# Patient Record
Sex: Female | Born: 1995 | Race: White | Hispanic: No | Marital: Single | Smoking: Never smoker
Health system: Southern US, Community
[De-identification: ages and names within clinical notes are randomized; demographics above are authoritative.]

---
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
## 2017-02-07 DIAGNOSIS — Z309 Encounter for contraceptive management, unspecified: Secondary | ICD-10-CM | POA: Diagnosis not present

## 2017-02-25 DIAGNOSIS — Z0000 Encounter for general adult medical examination without abnormal findings: Secondary | ICD-10-CM | POA: Diagnosis not present

## 2017-03-12 DIAGNOSIS — Z6828 Body mass index (BMI) 28.0-28.9, adult: Secondary | ICD-10-CM | POA: Diagnosis not present

## 2017-03-12 DIAGNOSIS — Z01419 Encounter for gynecological examination (general) (routine) without abnormal findings: Secondary | ICD-10-CM | POA: Diagnosis not present

## 2017-03-19 DIAGNOSIS — R05 Cough: Secondary | ICD-10-CM | POA: Diagnosis not present

## 2017-03-19 DIAGNOSIS — J069 Acute upper respiratory infection, unspecified: Secondary | ICD-10-CM | POA: Diagnosis not present

## 2017-03-19 DIAGNOSIS — R0602 Shortness of breath: Secondary | ICD-10-CM | POA: Diagnosis not present

## 2017-12-23 DIAGNOSIS — L988 Other specified disorders of the skin and subcutaneous tissue: Secondary | ICD-10-CM | POA: Diagnosis not present

## 2018-04-07 DIAGNOSIS — R5383 Other fatigue: Secondary | ICD-10-CM | POA: Diagnosis not present

## 2018-04-07 DIAGNOSIS — M791 Myalgia, unspecified site: Secondary | ICD-10-CM | POA: Diagnosis not present

## 2018-04-07 DIAGNOSIS — R509 Fever, unspecified: Secondary | ICD-10-CM | POA: Diagnosis not present

## 2018-04-07 DIAGNOSIS — J09X2 Influenza due to identified novel influenza A virus with other respiratory manifestations: Secondary | ICD-10-CM | POA: Diagnosis not present

## 2018-04-07 DIAGNOSIS — R51 Headache: Secondary | ICD-10-CM | POA: Diagnosis not present

## 2018-04-15 DIAGNOSIS — Z01419 Encounter for gynecological examination (general) (routine) without abnormal findings: Secondary | ICD-10-CM | POA: Diagnosis not present

## 2018-04-15 DIAGNOSIS — Z6829 Body mass index (BMI) 29.0-29.9, adult: Secondary | ICD-10-CM | POA: Diagnosis not present

## 2018-06-13 DIAGNOSIS — M549 Dorsalgia, unspecified: Secondary | ICD-10-CM | POA: Diagnosis not present

## 2018-09-15 DIAGNOSIS — Z9109 Other allergy status, other than to drugs and biological substances: Secondary | ICD-10-CM | POA: Diagnosis not present

## 2018-09-15 DIAGNOSIS — F411 Generalized anxiety disorder: Secondary | ICD-10-CM | POA: Diagnosis not present

## 2018-09-15 DIAGNOSIS — F3341 Major depressive disorder, recurrent, in partial remission: Secondary | ICD-10-CM | POA: Diagnosis not present

## 2018-10-17 DIAGNOSIS — F3341 Major depressive disorder, recurrent, in partial remission: Secondary | ICD-10-CM | POA: Diagnosis not present

## 2018-10-17 DIAGNOSIS — F411 Generalized anxiety disorder: Secondary | ICD-10-CM | POA: Diagnosis not present

## 2018-10-17 DIAGNOSIS — J3489 Other specified disorders of nose and nasal sinuses: Secondary | ICD-10-CM | POA: Diagnosis not present

## 2018-12-11 DIAGNOSIS — M7989 Other specified soft tissue disorders: Secondary | ICD-10-CM | POA: Diagnosis not present

## 2018-12-11 DIAGNOSIS — Z23 Encounter for immunization: Secondary | ICD-10-CM | POA: Diagnosis not present

## 2018-12-11 DIAGNOSIS — M79651 Pain in right thigh: Secondary | ICD-10-CM | POA: Diagnosis not present

## 2019-01-09 DIAGNOSIS — Z20828 Contact with and (suspected) exposure to other viral communicable diseases: Secondary | ICD-10-CM | POA: Diagnosis not present

## 2019-01-22 DIAGNOSIS — Z20828 Contact with and (suspected) exposure to other viral communicable diseases: Secondary | ICD-10-CM | POA: Diagnosis not present

## 2019-01-27 DIAGNOSIS — Z20828 Contact with and (suspected) exposure to other viral communicable diseases: Secondary | ICD-10-CM | POA: Diagnosis not present

## 2019-02-09 DIAGNOSIS — Z20828 Contact with and (suspected) exposure to other viral communicable diseases: Secondary | ICD-10-CM | POA: Diagnosis not present

## 2019-02-16 DIAGNOSIS — Z20828 Contact with and (suspected) exposure to other viral communicable diseases: Secondary | ICD-10-CM | POA: Diagnosis not present

## 2019-02-19 ENCOUNTER — Encounter: Payer: PRIVATE HEALTH INSURANCE | Admitting: Vascular Surgery

## 2019-02-19 ENCOUNTER — Encounter (HOSPITAL_COMMUNITY): Payer: PRIVATE HEALTH INSURANCE

## 2019-02-24 DIAGNOSIS — Z20828 Contact with and (suspected) exposure to other viral communicable diseases: Secondary | ICD-10-CM | POA: Diagnosis not present

## 2019-03-03 DIAGNOSIS — Z20828 Contact with and (suspected) exposure to other viral communicable diseases: Secondary | ICD-10-CM | POA: Diagnosis not present

## 2019-03-06 DIAGNOSIS — Z0000 Encounter for general adult medical examination without abnormal findings: Secondary | ICD-10-CM | POA: Diagnosis not present

## 2019-03-10 DIAGNOSIS — Z0000 Encounter for general adult medical examination without abnormal findings: Secondary | ICD-10-CM | POA: Diagnosis not present

## 2019-03-10 DIAGNOSIS — Z13220 Encounter for screening for lipoid disorders: Secondary | ICD-10-CM | POA: Diagnosis not present

## 2019-03-10 DIAGNOSIS — Z20828 Contact with and (suspected) exposure to other viral communicable diseases: Secondary | ICD-10-CM | POA: Diagnosis not present

## 2019-03-17 DIAGNOSIS — Z20828 Contact with and (suspected) exposure to other viral communicable diseases: Secondary | ICD-10-CM | POA: Diagnosis not present

## 2019-03-24 DIAGNOSIS — Z20828 Contact with and (suspected) exposure to other viral communicable diseases: Secondary | ICD-10-CM | POA: Diagnosis not present

## 2019-04-09 ENCOUNTER — Encounter: Payer: PRIVATE HEALTH INSURANCE | Admitting: Vascular Surgery

## 2019-04-09 ENCOUNTER — Encounter (HOSPITAL_COMMUNITY): Payer: PRIVATE HEALTH INSURANCE

## 2019-04-14 ENCOUNTER — Telehealth (HOSPITAL_COMMUNITY): Payer: Self-pay

## 2019-04-14 NOTE — Telephone Encounter (Signed)

## 2019-04-15 ENCOUNTER — Other Ambulatory Visit: Payer: Self-pay

## 2019-04-15 ENCOUNTER — Ambulatory Visit (HOSPITAL_COMMUNITY)
Admission: RE | Admit: 2019-04-15 | Discharge: 2019-04-15 | Disposition: A | Discharge status: Home / Self Care | Payer: BC Managed Care – PPO | Source: Ambulatory Visit | Attending: Surgery | Admitting: Surgery

## 2019-04-15 DIAGNOSIS — M79651 Pain in right thigh: Secondary | ICD-10-CM

## 2019-04-16 ENCOUNTER — Ambulatory Visit: Payer: BC Managed Care – PPO | Admitting: Vascular Surgery

## 2019-04-16 ENCOUNTER — Other Ambulatory Visit: Payer: Self-pay

## 2019-04-16 ENCOUNTER — Encounter: Payer: Self-pay | Admitting: Vascular Surgery

## 2019-04-16 VITALS — BP 129/83 | HR 85 | Temp 97.9°F | Resp 16 | Ht 64.0 in | Wt 152.0 lb

## 2019-04-16 DIAGNOSIS — D1809 Hemangioma of other sites: Secondary | ICD-10-CM

## 2019-04-16 DIAGNOSIS — M79651 Pain in right thigh: Secondary | ICD-10-CM

## 2019-04-16 NOTE — Progress Notes (Signed)
REASON FOR CONSULT:    To evaluate right thigh pain.  The consult is requested by Dr. Jenny Reichmann.  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  She states that she has had a persistent mass year and persistent pain and has had no further work-up of this.  For this reason I recommended that we obtain a follow-up MRI of the thigh with and without contrast.  I discussed this with radiology who feels that this is the best test for follow-up.  I will have a virtual visit over the phone with her after her MRI is complete.  CHRONIC VENOUS INSUFFICIENCY: Based on her venous duplex scan today she does have deep venous reflux involving the right common femoral vein.  I do not think this would explain her symptoms.  She has no evidence of DVT and no superficial venous reflux.  We have discussed the importance of intermittent leg elevation and the proper positioning for this.  We discussed possibly using compression stockings during the day however she is very active and does not spend much time standing or sitting.   Deitra Mayo, MD Office: [phone]   HPI:   Julie Drake is a pleasant 24 y.o. female, who was referred with right thigh pain.  I have reviewed the records from the referring office.  The patient was seen by Genelle Bal, FNP on 12/11/2018.  The patient had a cyst on the right thigh which was inflamed and tender to touch.  She had noted that this had been there since fifth grade.  This has been worked up in the past including MRI apparently 8 years ago which showed possibly a small hemangioma.  Patient was referred for vascular consultation.  On my history this patient has had this mass in the right thigh for over 10 years.  She states that it is tender and becomes more tender when she is very  active.  Apparently she is very active and exercises quite a bit and this is when this bothers her the most.  She does describe some occasional aching pain in her legs associated with standing and relieved with elevation.  The pain in her right thigh is relieved with elevation.  History reviewed. No pertinent past medical history.  Family History  Problem Relation Age of Onset  . Hypertension Mother     SOCIAL HISTORY: Social History   Socioeconomic History  . Marital status: Single    Spouse name: Not on file  . Number of children: Not on file  . Years of education: Not on file  . Highest education level: Not on file  Occupational History  . Not on file  Tobacco Use  . Smoking status: Never Smoker  . Smokeless tobacco: Never Used  Substance and Sexual Activity  . Alcohol use: No  . Drug use: No  . Sexual activity: Not on file  Other Topics Concern  . Not on file  Social History Narrative  . Not on file   Social Determinants of Health   Financial Resource Strain:   . Difficulty of Paying Living Expenses: Not on file  Food Insecurity:   . Worried About Charity fundraiser in the Last Year: Not on file  . Ran Out of Food in the Last Year: Not on file  Transportation Needs:   . Film/video editor (Medical): Not on file  . Lack of Transportation (Non-Medical): Not on file  Physical Activity:   . Days of Exercise per Week: Not on file  . Minutes of Exercise per Session: Not on file  Stress:   . Feeling of Stress : Not on file  Social Connections:   . Frequency of Communication with Friends and Family: Not on file  . Frequency of Social Gatherings with Friends and Family: Not on file  . Attends Religious Services: Not on file  . Active Member of Clubs or Organizations: Not on file  . Attends Archivist Meetings: Not on file  . Marital Status: Not on file  Intimate Partner Violence:   . Fear of Current or Ex-Partner: Not on file  . Emotionally Abused:  Not on file  . Physically Abused: Not on file  . Sexually Abused: Not on file    No Known Allergies  Current Outpatient Medications  Medication Sig Dispense Refill  . amoxicillin (AMOXIL) 875 MG tablet Take 1 tablet (875 mg total) by mouth 2 (two) times daily. 20 tablet 0  . ibuprofen (ADVIL,MOTRIN) 200 MG tablet Take 200 mg by mouth every 6 (six) hours as needed.    . Pseudoephedrine-APAP-DM (DAYQUIL PO) Take by mouth.     No current facility-administered medications for this visit.    REVIEW OF SYSTEMS:  [X]  denotes positive finding, [ ]  denotes negative finding Cardiac  Comments:  Chest pain or chest pressure:    Shortness of breath upon exertion:    Short of breath when lying flat:    Irregular heart rhythm:        Vascular    Pain in calf, thigh, or hip brought on by ambulation:    Pain in feet at night that wakes you up from your sleep:     Blood clot in your veins:    Leg swelling:         Pulmonary    Oxygen at home:    Productive cough:     Wheezing:         Neurologic    Sudden weakness in arms or legs:     Sudden numbness in arms or legs:     Sudden onset of difficulty speaking or slurred speech:    Temporary loss of vision in one eye:     Problems with dizziness:         Gastrointestinal    Blood in stool:     Vomited blood:         Genitourinary    Burning when urinating:     Blood in urine:        Psychiatric    Major depression:         Hematologic    Bleeding problems:    Problems with blood clotting too easily:        Skin    Rashes or ulcers:        Constitutional    Fever or chills:     PHYSICAL EXAM:   Vitals:   04/16/19 0931  Resp: 16  Weight: 152 lb (68.9 kg)  Height: 5\' 4"  (1.626 m)    GENERAL: The patient is a well-nourished female, in no acute distress. The vital signs are documented above. CARDIAC: There is a regular rate and rhythm.  VASCULAR: I do not detect carotid bruits. She has palpable femoral popliteal and  pedal pulses bilaterally. She does have what  appears to be a small mass in the right proximal to mid thigh.  I looked at this area with the [place] myself and I did not identify any mass however although this is clearly a limited study. She has no significant lower extremity swelling, hyperpigmentation, or significant varicose veins. PULMONARY: There is good air exchange bilaterally without wheezing or rales. ABDOMEN: Soft and non-tender with normal pitched bowel sounds.  MUSCULOSKELETAL: There are no major deformities or cyanosis. NEUROLOGIC: No focal weakness or paresthesias are detected. SKIN: There are no ulcers or rashes noted. PSYCHIATRIC: The patient has a normal affect.  DATA:    VENOUS DUPLEX: I have independently interpreted her right lower extremity venous duplex scan.  There is no evidence of DVT and no evidence of superficial venous thrombosis.  There is deep venous reflux involving the common femoral vein.  There is no significant superficial venous reflux.  Previous MRI 8 years ago which showed a small soft tissue lesion possibly consistent with a hemangioma or vascular malformation in the right anterior thigh.  This was not visualized on duplex today.  MRI: I have reviewed the MRI of the right thigh that was done on 02/10/2011.  I reviewed the images of her MRI and the finding to me is subtle.

## 2019-04-27 ENCOUNTER — Other Ambulatory Visit: Payer: Self-pay

## 2019-04-27 ENCOUNTER — Ambulatory Visit (HOSPITAL_COMMUNITY)
Admission: RE | Admit: 2019-04-27 | Discharge: 2019-04-27 | Disposition: A | Discharge status: Home / Self Care | Payer: BC Managed Care – PPO | Source: Ambulatory Visit | Attending: Vascular Surgery | Admitting: Vascular Surgery

## 2019-04-27 DIAGNOSIS — R2241 Localized swelling, mass and lump, right lower limb: Secondary | ICD-10-CM | POA: Diagnosis not present

## 2019-04-27 DIAGNOSIS — M79651 Pain in right thigh: Secondary | ICD-10-CM | POA: Diagnosis not present

## 2019-04-27 DIAGNOSIS — D1809 Hemangioma of other sites: Secondary | ICD-10-CM

## 2019-04-27 MED ORDER — GADOBUTROL 1 MMOL/ML IV SOLN
7.0000 mL | Freq: Once | INTRAVENOUS | Status: AC | PRN
  Administered 2019-04-27: 7 mL via INTRAVENOUS

## 2019-07-17 DIAGNOSIS — Z01419 Encounter for gynecological examination (general) (routine) without abnormal findings: Secondary | ICD-10-CM | POA: Diagnosis not present

## 2019-07-17 DIAGNOSIS — Z113 Encounter for screening for infections with a predominantly sexual mode of transmission: Secondary | ICD-10-CM | POA: Diagnosis not present

## 2019-07-17 DIAGNOSIS — Z6826 Body mass index (BMI) 26.0-26.9, adult: Secondary | ICD-10-CM | POA: Diagnosis not present

## 2019-08-06 DIAGNOSIS — R102 Pelvic and perineal pain: Secondary | ICD-10-CM | POA: Diagnosis not present

## 2019-08-06 DIAGNOSIS — N920 Excessive and frequent menstruation with regular cycle: Secondary | ICD-10-CM | POA: Diagnosis not present

## 2020-09-16 IMAGING — MR MR FEMUR*R* WO/W CM
8 series · 35 of 40 positions shown · IV contrast (gadavist)
Comparison: MRI 02/10/2011

CLINICAL DATA: Re-evaluate right thigh mass. Patient reports pain
associated with the mass.

EXAM:
MRI OF THE RIGHT FEMUR WITHOUT AND WITH CONTRAST
TECHNIQUE: Multiplanar, multisequence MR imaging of the right thigh was
performed both before and after administration of intravenous
contrast.
CONTRAST:  7mL GADAVIST GADOBUTROL 1 MMOL/ML IV SOLN

[Series 3: T1 · coronal · right · 4.0mm · 1.95mm/px · 4 of 40 slices shown (1 of 2)]
[im 1/40]
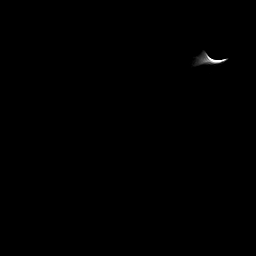
[im 14/40]
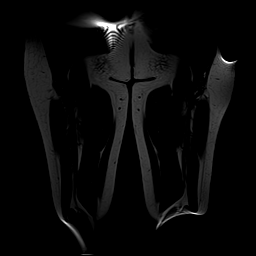
[im 27/40]
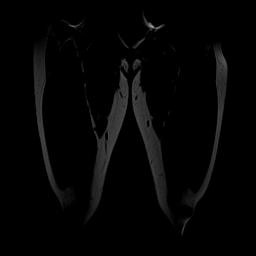
[im 40/40]
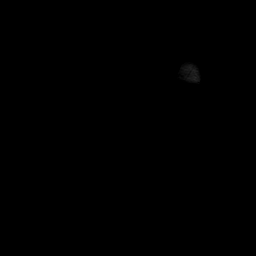

[Series 4: STIR · coronal · right · 4.0mm · 1.95mm/px · 4 of 40 slices shown (1 of 2)]
[im 1/40]
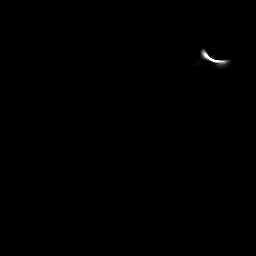
[im 14/40]
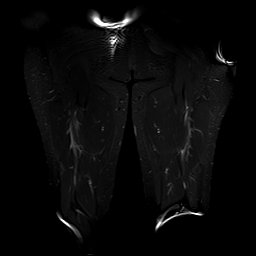
[im 27/40]
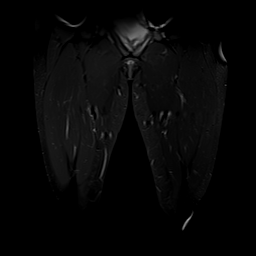
[im 40/40]
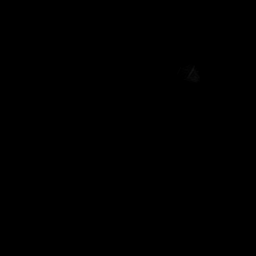

[Series 5: STIR · sagittal · right · 4.0mm · 1.72mm/px · 4 of 40 slices shown (2 of 2)]
[im 1/40]
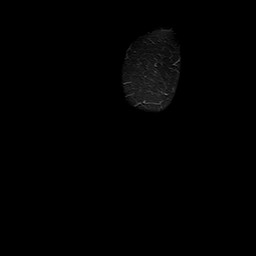
[im 14/40]
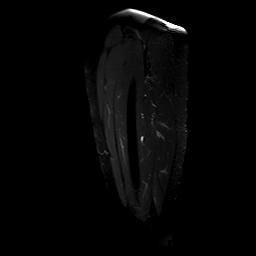
[im 27/40]
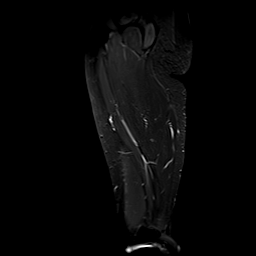
[im 40/40]
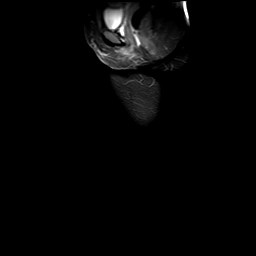

[Series 6: T1 · axial · right · 4.0mm · 1.02mm/px · z∈[-121,+174]mm · 6 of 60 slices shown (2 of 2)]
[im 1/60]
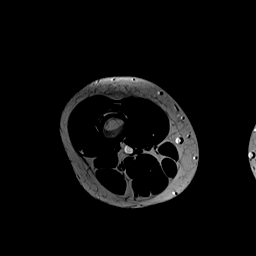
[im 12/60]
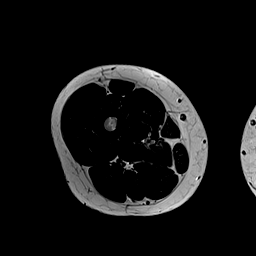
[im 24/60]
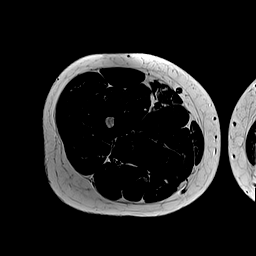
[im 36/60]
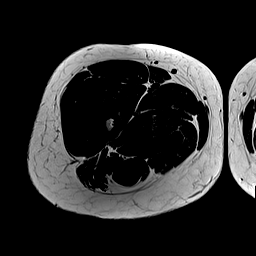
[im 48/60]
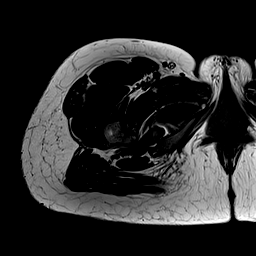
[im 60/60]
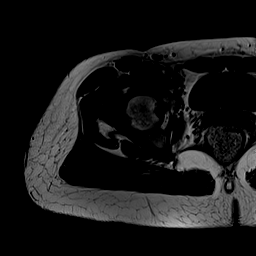

[Series 7: T2 fat-sat · axial · right · 4.0mm · 1.02mm/px · z∈[-121,+174]mm · 6 of 60 slices shown]
[im 1/60]
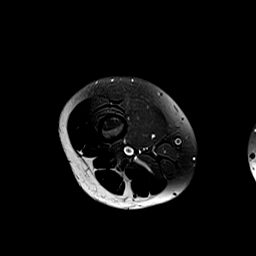
[im 12/60]
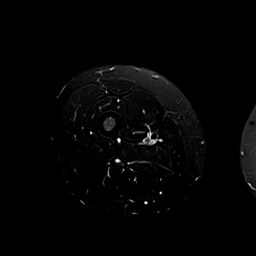
[im 24/60]
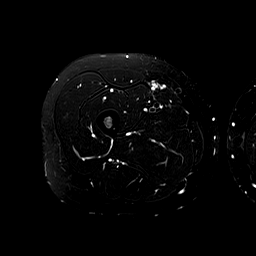
[im 36/60]
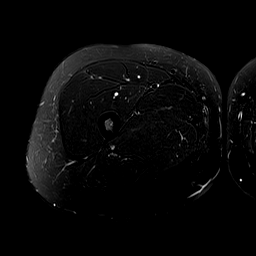
[im 48/60]
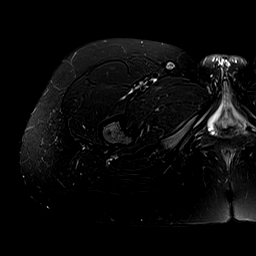
[im 60/60]
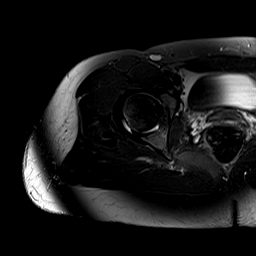

[Series 8: pre axial ti · axial · non-contrast · right · 4.0mm · 0.51mm/px · z∈[-121,+174]mm · 6 of 60 slices shown]
[im 1/60]
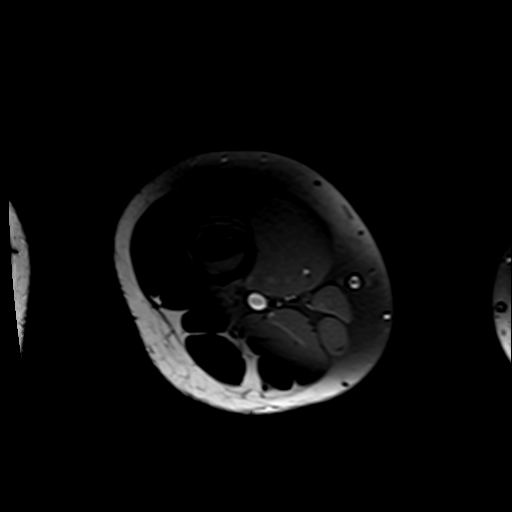
[im 12/60]
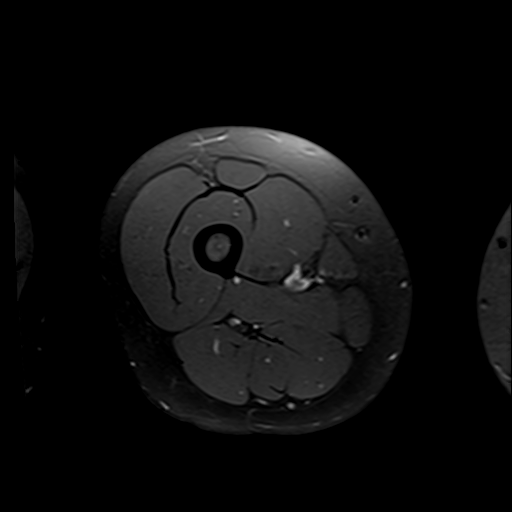
[im 24/60]
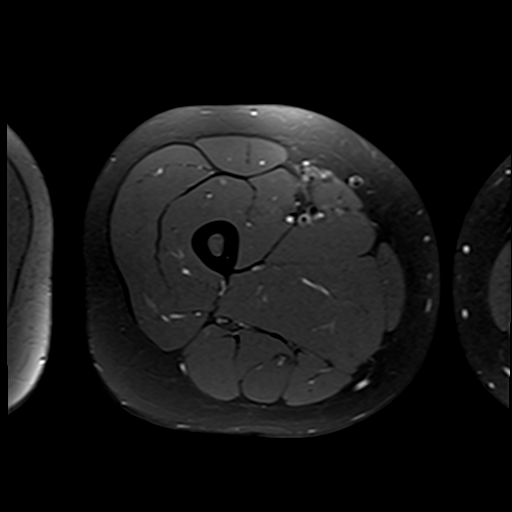
[im 36/60]
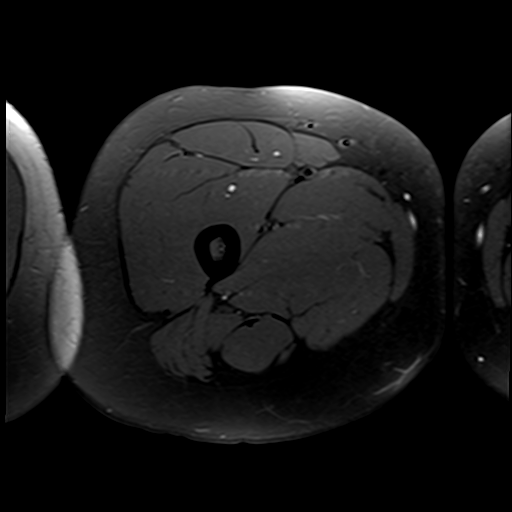
[im 48/60]
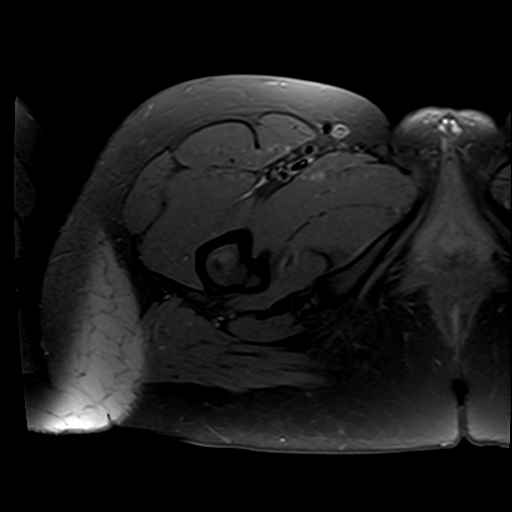
[im 60/60]
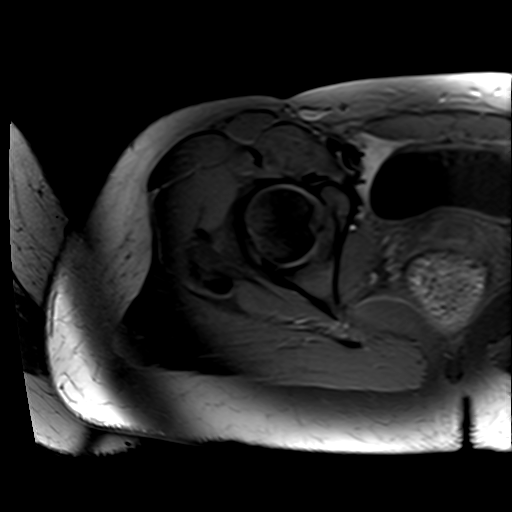

[Series 9: post sag ti · sagittal · right · 4.0mm · 0.86mm/px · 4 of 40 slices shown]
[im 1/40]
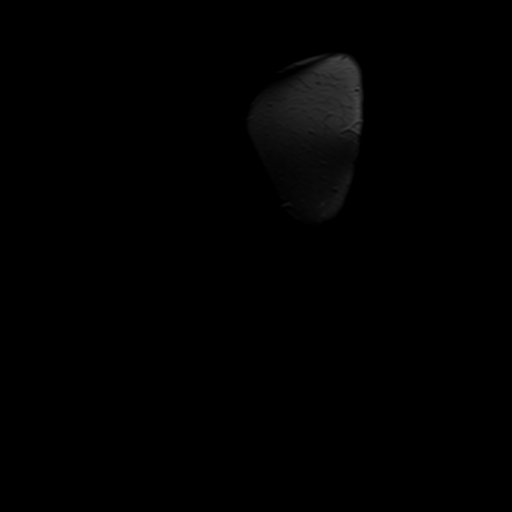
[im 14/40]
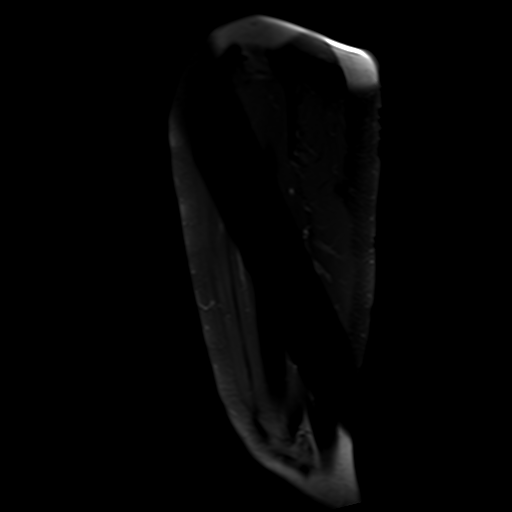
[im 27/40]
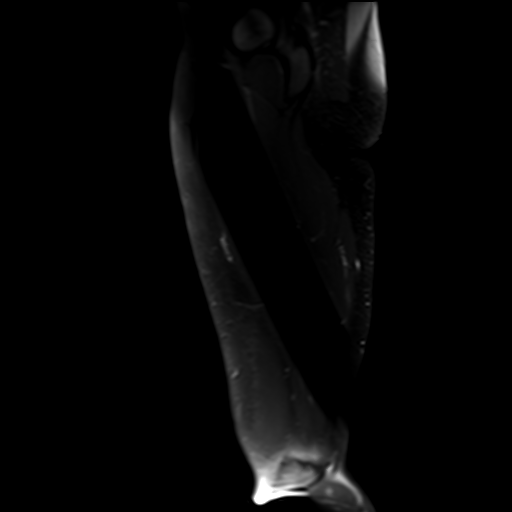
[im 40/40]
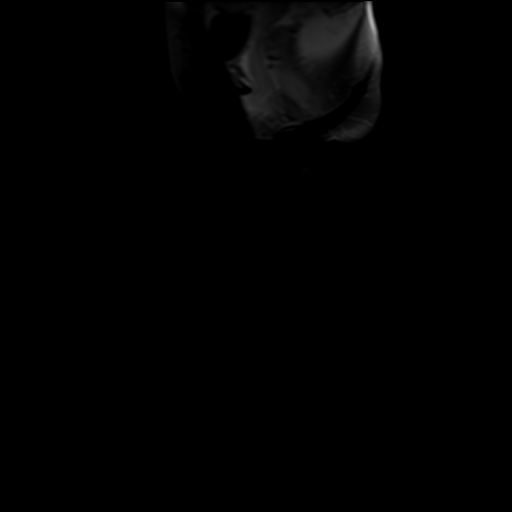

[Series 10: post axial ti · axial · right · 4.0mm · 0.51mm/px · 1 of 60 slices shown]
[im 1/60]
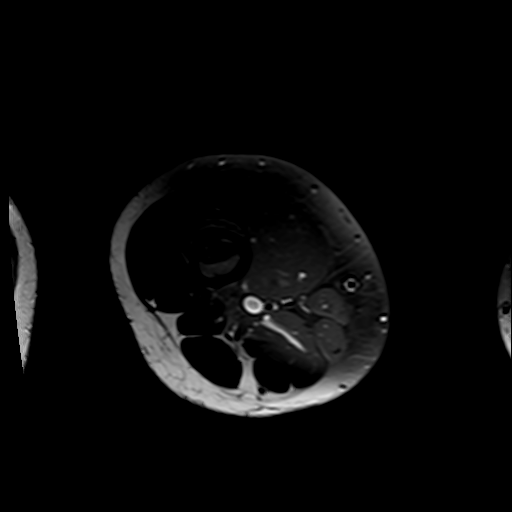

[35 of 40 positions shown; findings below may reference images not displayed]

FINDINGS: Bones/Joint/Cartilage

No acute fracture or malalignment. No femoral head AVN. No
suspicious bone lesion. No focal bone marrow signal abnormality. No
right hip joint effusion.

Ligaments

Intact.

Muscles and Tendons

Normal muscle bulk and signal intensity. Visualized tendinous
structures are intact.

Soft tissues

There are 2 skin markers placed over the anteromedial aspect of the
mid thigh demarcating the site of patient's palpable abnormality.
This site corresponds to a lobulated T1 isointense, T2 hyperintense
mass within the soft tissues of the right thigh is between the
sartorius and rectus femoris muscles anterior to the superficial
femoral artery and vein. Lesion heterogeneously enhances following
the administration of gadolinium contrast. Mass measures
approximately 1.8 x 1.0 x 2.0 cm (series 7, image 36; series 5,
image 25). Previous measurements of 1.7 x 1.5 x 1.9 cm on
02/10/2011, remeasured. Multiple serpiginous vascular structures are
noted extending into and arising from this lesion. No surrounding
soft tissue or intramuscular edema.

No additional lesions. Remaining soft tissues are unremarkable in
appearance.
IMPRESSION: No significant change in the size or appearance of a lobulated mass
within the soft tissues of the anteromedial mid right thigh
measuring up to 2.0 cm. Imaging features are most compatible with a
vascular malformation/hemangioma.
